# Patient Record
Sex: Male | Born: 1985 | Race: Black or African American | Hispanic: No | Marital: Single | Smoking: Current every day smoker
Health system: Southern US, Community
[De-identification: ages and names within clinical notes are randomized; demographics above are authoritative.]

---
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
## 2006-07-31 ENCOUNTER — Emergency Department (HOSPITAL_COMMUNITY): Admission: EM | Admit: 2006-07-31 | Discharge: 2006-07-31 | Payer: Self-pay | Admitting: Emergency Medicine

## 2021-01-28 ENCOUNTER — Emergency Department (HOSPITAL_COMMUNITY): Payer: No Typology Code available for payment source

## 2021-01-28 ENCOUNTER — Other Ambulatory Visit: Payer: Self-pay

## 2021-01-28 ENCOUNTER — Encounter (HOSPITAL_COMMUNITY): Payer: Self-pay

## 2021-01-28 ENCOUNTER — Emergency Department (HOSPITAL_COMMUNITY)
Admission: EM | Admit: 2021-01-28 | Discharge: 2021-01-29 | Disposition: A | Discharge status: Home / Self Care | Payer: No Typology Code available for payment source | Attending: Emergency Medicine | Admitting: Emergency Medicine

## 2021-01-28 DIAGNOSIS — F17210 Nicotine dependence, cigarettes, uncomplicated: Secondary | ICD-10-CM | POA: Insufficient documentation

## 2021-01-28 DIAGNOSIS — R569 Unspecified convulsions: Secondary | ICD-10-CM | POA: Diagnosis present

## 2021-01-28 MED ORDER — SODIUM CHLORIDE 0.9 % IV BOLUS
1000.0000 mL | Freq: Once | INTRAVENOUS | Status: AC
  Administered 2021-01-29: 1000 mL via INTRAVENOUS

## 2021-01-28 NOTE — ED Notes (Signed)
Patient transported to CT 
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
## 2021-01-29 ENCOUNTER — Emergency Department (HOSPITAL_COMMUNITY): Payer: No Typology Code available for payment source

## 2021-01-29 LAB — CBC
HCT: 41.3 % (ref 39.0–52.0)
Hemoglobin: 13.9 g/dL (ref 13.0–17.0)
MCH: 31.5 pg (ref 26.0–34.0)
MCHC: 33.7 g/dL (ref 30.0–36.0)
MCV: 93.7 fL (ref 80.0–100.0)
Platelets: 243 10*3/uL (ref 150–400)
RBC: 4.41 MIL/uL (ref 4.22–5.81)
RDW: 13.3 % (ref 11.5–15.5)
WBC: 13.9 10*3/uL — ABNORMAL HIGH (ref 4.0–10.5)
nRBC: 0 % (ref 0.0–0.2)

## 2021-01-29 LAB — COMPREHENSIVE METABOLIC PANEL
ALT: 17 U/L (ref 0–44)
AST: 19 U/L (ref 15–41)
Albumin: 4.5 g/dL (ref 3.5–5.0)
Alkaline Phosphatase: 68 U/L (ref 38–126)
Anion gap: 10 (ref 5–15)
BUN: 7 mg/dL (ref 6–20)
CO2: 22 mmol/L (ref 22–32)
Calcium: 9.6 mg/dL (ref 8.9–10.3)
Chloride: 102 mmol/L (ref 98–111)
Creatinine, Ser: 0.97 mg/dL (ref 0.61–1.24)
GFR, Estimated: >60 mL/min (ref >60)
Glucose, Bld: 123 mg/dL — ABNORMAL HIGH (ref 70–99)
Potassium: 3.8 mmol/L (ref 3.5–5.1)
Sodium: 134 mmol/L — ABNORMAL LOW (ref 135–145)
Total Bilirubin: 0.7 mg/dL (ref 0.3–1.2)
Total Protein: 8.2 g/dL — ABNORMAL HIGH (ref 6.5–8.1)

## 2021-01-29 LAB — LACTIC ACID, PLASMA: Lactic Acid, Venous: 1.5 mmol/L (ref 0.5–1.9)

## 2021-01-29 NOTE — ED Notes (Signed)
Pt awake gcs15, holding conversation with family at bedside. NADN, VSS  On ccm.
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
